# Patient Record
Sex: Female | Born: 1980 | Race: Black or African American | Hispanic: No | Marital: Single | Smoking: Current every day smoker
Health system: Southern US, Community
[De-identification: ages and names within clinical notes are randomized; demographics above are authoritative.]

---

## 2017-02-12 ENCOUNTER — Encounter (HOSPITAL_BASED_OUTPATIENT_CLINIC_OR_DEPARTMENT_OTHER): Payer: Self-pay | Admitting: *Deleted

## 2017-02-12 ENCOUNTER — Emergency Department (HOSPITAL_BASED_OUTPATIENT_CLINIC_OR_DEPARTMENT_OTHER)
Admission: EM | Admit: 2017-02-12 | Discharge: 2017-02-12 | Disposition: A | Discharge status: Home / Self Care | Payer: Medicaid Other | Attending: Emergency Medicine | Admitting: Emergency Medicine

## 2017-02-12 DIAGNOSIS — K047 Periapical abscess without sinus: Secondary | ICD-10-CM | POA: Diagnosis not present

## 2017-02-12 DIAGNOSIS — K0889 Other specified disorders of teeth and supporting structures: Secondary | ICD-10-CM | POA: Diagnosis present

## 2017-02-12 DIAGNOSIS — F17200 Nicotine dependence, unspecified, uncomplicated: Secondary | ICD-10-CM | POA: Insufficient documentation

## 2017-02-12 MED ORDER — CLINDAMYCIN HCL 150 MG PO CAPS
450.0000 mg | ORAL_CAPSULE | Freq: Once | ORAL | Status: AC
  Administered 2017-02-12: 450 mg via ORAL

## 2017-02-12 MED ORDER — HYDROCODONE-ACETAMINOPHEN 5-325 MG PO TABS
1.0000 | ORAL_TABLET | Freq: Once | ORAL | Status: AC
  Administered 2017-02-12: 1 via ORAL

## 2017-02-12 MED ORDER — CLINDAMYCIN HCL 150 MG PO CAPS: 300.0000 mg | ORAL_CAPSULE | Freq: Four times a day (QID) | ORAL | 0 refills | Status: AC

## 2017-02-12 MED ORDER — HYDROCODONE-ACETAMINOPHEN 5-325 MG PO TABS
ORAL_TABLET | ORAL | Status: AC
  Filled 2017-02-12: qty 1

## 2017-02-12 MED ORDER — CLINDAMYCIN HCL 150 MG PO CAPS
ORAL_CAPSULE | ORAL | Status: AC
  Filled 2017-02-12: qty 3

## 2017-02-12 MED ORDER — IBUPROFEN 800 MG PO TABS: 800.0000 mg | ORAL_TABLET | Freq: Three times a day (TID) | ORAL | 0 refills | Status: AC | PRN

## 2017-02-12 NOTE — ED Triage Notes (Signed)
Pt c/o dental pain x 2 days.  

## 2017-02-12 NOTE — Discharge Instructions (Signed)
You have a dental infection. It is very important that you get evaluated by a dentist as soon as possible. Call tomorrow to schedule an appointment. Ibuprofen as needed for pain. Take your full course of antibiotics. Read the instructions below.  Eat a soft or liquid diet and rinse your mouth out after meals with warm water. You should see a dentist or return here at once if you have increased swelling, increased pain or uncontrolled bleeding from the site of your injury.  SEEK MEDICAL CARE IF:  You have increased pain not controlled with medicines.  You have swelling around your tooth, in your face or neck.  You have bleeding which starts, continues, or gets worse.  You have a fever >100.4 New or worsening symptoms develop, any additional concerns.

## 2017-02-12 NOTE — ED Provider Notes (Signed)
[facility] Provider [facility]   CSN: [id number] Arrival date & time: 02/12/17  0002     History   Chief Complaint Chief Complaint  Patient presents with  . Dental Pain    HPI Kizzie FantasiaDanna Cates is a 36 y.o. female.  The history is provided by the patient and medical records. No language interpreter was used.  Dental Pain      Zoee Rosalia HammersRay is a 36 y.o. female who presents to the Emergency Department complaining of persistent, gradually worsening, right-sided, upper dental pain beginning yesterday. Pt describes their pain as throbbing.  Pain is exacerbated by eating or anything touching the tooth. They are not followed by dentistry.  She notes this same tooth broke about 2 months ago, but has not caused her any problems until yesterday. Pt denies facial swelling, fever, chills, difficulty breathing, difficulty swallowing.   History reviewed. No pertinent past medical history.  There are no active problems to display for this patient.   History reviewed. No pertinent surgical history.  OB History    No data available       Home Medications    Prior to Admission medications   Medication Sig Start Date End Date Taking? Authorizing Provider  clindamycin (CLEOCIN) 150 MG capsule Take 2 capsules (300 mg total) by mouth 4 (four) times daily. 02/12/17   Ward, Chase PicketJaime Pilcher, PA-C  ibuprofen (ADVIL,MOTRIN) 800 MG tablet Take 1 tablet (800 mg total) by mouth every 8 (eight) hours as needed for moderate pain. 02/12/17   Ward, Chase PicketJaime Pilcher, PA-C    Family History No family history on file.  Social History Social History  Substance Use Topics  . Smoking status: Current Every Day Smoker    Packs/day: 0.50  . Smokeless tobacco: Not on file  . Alcohol use No     Allergies   Patient has no known allergies.   Review of Systems Review of Systems  Constitutional: Negative for chills and fever.  HENT: Positive for dental problem. Negative for trouble swallowing.   Respiratory:  Negative for shortness of breath.      Physical Exam Updated Vital Signs BP (!) 165/99   Pulse 88   Temp 98.7 F (37.1 C)   Resp 16   Ht 5' 6.5" (1.689 m)   Wt 83 kg (183 lb)   LMP 02/04/2017   SpO2 100%   BMI 29.09 kg/m   Physical Exam  Constitutional: She appears well-developed and well-nourished. No distress.  HENT:  Head: Normocephalic and atraumatic.  Mouth/Throat:    Dental cavities and poor oral dentition noted. Pain along teeth as depicted in image. No abscess noted. Midline uvula. No trismus. OP moist and clear. No oropharyngeal erythema or edema. Neck supple with no tenderness. No facial edema.  Neck: Neck supple.  Cardiovascular: Normal rate, regular rhythm and normal heart sounds.   No murmur heard. Pulmonary/Chest: Effort normal and breath sounds normal. No respiratory distress. She has no wheezes. She has no rales.  Musculoskeletal: Normal range of motion.  Neurological: She is alert.  Skin: Skin is warm and dry.  Nursing note and vitals reviewed.    ED Treatments / Results  Labs (all labs ordered are listed, but only abnormal results are displayed) Labs Reviewed - No data to display  EKG  EKG Interpretation None       Radiology No results found.  Procedures Procedures (including critical care time)  Medications Ordered in ED Medications  clindamycin (CLEOCIN) capsule 450 mg (not administered)  HYDROcodone-acetaminophen (  NORCO/VICODIN) 5-325 MG per tablet 1 tablet (not administered)     Initial Impression / Assessment and Plan / ED Course  I have reviewed the triage vital signs and the nursing notes.  Pertinent labs & imaging results that were available during my care of the patient were reviewed by me and considered in my medical decision making (see chart for details).    Rozetta Stumpp is a 36 y.o. female who presents to ED for dental pain. No abscess requiring immediate incision and drainage. Patient is afebrile, non toxic appearing,  and swallowing secretions well. Exam not concerning for Ludwig's angina or pharyngeal abscess. Will treat with Clindamycin. I provided dental resource guide and stressed the importance of dental follow up for ultimate management of dental pain. Patient voices understanding and is agreeable to plan.  Final Clinical Impressions(s) / ED Diagnoses   Final diagnoses:  Dental infection    New Prescriptions New Prescriptions   CLINDAMYCIN (CLEOCIN) 150 MG CAPSULE    Take 2 capsules (300 mg total) by mouth 4 (four) times daily.   IBUPROFEN (ADVIL,MOTRIN) 800 MG TABLET    Take 1 tablet (800 mg total) by mouth every 8 (eight) hours as needed for moderate pain.     Ward, Chase Picket, PA-C 02/12/17 0103    Zadie Rhine, MD 02/12/17 [phone]

## 2017-09-09 ENCOUNTER — Other Ambulatory Visit: Payer: Self-pay

## 2017-09-09 ENCOUNTER — Emergency Department (HOSPITAL_BASED_OUTPATIENT_CLINIC_OR_DEPARTMENT_OTHER)
Admission: EM | Admit: 2017-09-09 | Discharge: 2017-09-09 | Disposition: A | Discharge status: Home / Self Care | Payer: Self-pay | Attending: Emergency Medicine | Admitting: Emergency Medicine

## 2017-09-09 ENCOUNTER — Emergency Department (HOSPITAL_BASED_OUTPATIENT_CLINIC_OR_DEPARTMENT_OTHER): Payer: Self-pay

## 2017-09-09 ENCOUNTER — Encounter (HOSPITAL_BASED_OUTPATIENT_CLINIC_OR_DEPARTMENT_OTHER): Payer: Self-pay

## 2017-09-09 DIAGNOSIS — M79604 Pain in right leg: Secondary | ICD-10-CM | POA: Insufficient documentation

## 2017-09-09 DIAGNOSIS — M79671 Pain in right foot: Secondary | ICD-10-CM | POA: Insufficient documentation

## 2017-09-09 DIAGNOSIS — F17210 Nicotine dependence, cigarettes, uncomplicated: Secondary | ICD-10-CM | POA: Insufficient documentation

## 2017-09-09 DIAGNOSIS — R030 Elevated blood-pressure reading, without diagnosis of hypertension: Secondary | ICD-10-CM | POA: Insufficient documentation

## 2017-09-09 LAB — PREGNANCY, URINE: PREG TEST UR: NEGATIVE

## 2017-09-09 MED ORDER — ACETAMINOPHEN 325 MG PO TABS
650.0000 mg | ORAL_TABLET | Freq: Once | ORAL | Status: AC
  Administered 2017-09-09: 650 mg via ORAL
  Filled 2017-09-09: qty 2

## 2017-09-09 MED ORDER — DICLOFENAC SODIUM 1 % TD GEL: 2.0000 g | Freq: Four times a day (QID) | TRANSDERMAL | 0 refills | Status: AC

## 2017-09-09 NOTE — ED Provider Notes (Signed)
[facility] EMERGENCY DEPARTMENT Provider Note   CSN: [id number] Arrival date & time: 09/09/17  1737     History   Chief Complaint Chief Complaint  Patient presents with  . Foot Pain    HPI Tina Lamb is a 37 y.o. female.  HPI  Patient is a 37 year old female with no significant past medical history presenting for right foot pain.  Patient reports that for the past 1-1.5 months, she has felt pain radiating from her heel up the back of her leg and up to her posterior. Patient reports that the pain will originate in her heel, and she feels she is walking on an object. Patient reports that she will feel the sharp pain rating up her leg approximately 4-5 times a day.  Patient reports she thought she may have noticed a color change with slightly blue coloration to the extremity couple weeks ago, but has not noticed any pallor or erythema.  No weakness or numbness of the right lower extremity.  No remedies tried for symptoms.   History reviewed. No pertinent past medical history.  There are no active problems to display for this patient.   History reviewed. No pertinent surgical history.   OB History   None      Home Medications    Prior to Admission medications   Medication Sig Start Date End Date Taking? Authorizing Provider  clindamycin (CLEOCIN) 150 MG capsule Take 2 capsules (300 mg total) by mouth 4 (four) times daily. 02/12/17   Ward, Chase PicketJaime Pilcher, PA-C  ibuprofen (ADVIL,MOTRIN) 800 MG tablet Take 1 tablet (800 mg total) by mouth every 8 (eight) hours as needed for moderate pain. 02/12/17   Ward, Chase PicketJaime Pilcher, PA-C    Family History No family history on file.  Social History Social History   Tobacco Use  . Smoking status: Current Every Day Smoker    Packs/day: 0.50  . Smokeless tobacco: Never Used  Substance Use Topics  . Alcohol use: No  . Drug use: No     Allergies   Patient has no known allergies.   Review of Systems Review of Systems    Constitutional: Negative for chills and fever.  Musculoskeletal: Positive for arthralgias. Negative for joint swelling and myalgias.  Neurological: Negative for weakness and numbness.     Physical Exam Updated Vital Signs BP (!) 170/114 (BP Location: Right Arm)   Pulse 88   Temp 98.1 F (36.7 C) (Oral)   Resp 20   Ht 5\' 6"  (1.676 m)   Wt 88.5 kg (195 lb 3.2 oz)   LMP 08/24/2017   SpO2 100%   BMI 31.51 kg/m   Physical Exam  Constitutional: She appears well-developed and well-nourished. No distress.  Sitting comfortably in bed.  HENT:  Head: Normocephalic and atraumatic.  Eyes: Conjunctivae are normal. Right eye exhibits no discharge. Left eye exhibits no discharge.  EOMs normal to gross examination.  Neck: Normal range of motion.  Cardiovascular: Normal rate and regular rhythm.  Intact, 2+ DP and PT pulses of bilateral lower extremity's.  Pulses symmetric.  Pulmonary/Chest:  Normal respiratory effort. Patient converses comfortably. No audible wheeze or stridor.  Abdominal: She exhibits no distension.  Musculoskeletal: Normal range of motion.  No erythema, discoloration, ecchymosis, or deformity of the right foot.  Right foot with tenderness to palpation of calcaneus.  No tenderness palpation of metatarsals or phalanges.  Compartments of the right foot are soft.  2+ DP and PT pulses.  Right knee with tenderness to  palpation of lateral knee. Full ROM. No joint line tenderness. No joint effusion or swelling appreciated. No abnormal alignment or patellar mobility. No bruising, erythema or warmth overlaying the joint. No varus/valgus laxity. Negative drawer's, Lachman's and McMurray's.  No crepitus.   All compartments are soft. Sensation intact distally.  All compartments of right lower extremity soft.  Neurological: She is alert.  Cranial nerves intact to gross observation. Patient moves extremities without difficulty.  Skin: Skin is warm and dry. She is not diaphoretic.   Psychiatric: She has a normal mood and affect. Her behavior is normal. Judgment and thought content normal.  Nursing note and vitals reviewed.    ED Treatments / Results  Labs (all labs ordered are listed, but only abnormal results are displayed) Labs Reviewed  PREGNANCY, URINE    EKG None  Radiology No results found.  Procedures Procedures (including critical care time)  Medications Ordered in ED Medications - No data to display   Initial Impression / Assessment and Plan / ED Course  I have reviewed the triage vital signs and the nursing notes.  Pertinent labs & imaging results that were available during my care of the patient were reviewed by me and considered in my medical decision making (see chart for details).     Patient well-appearing and in no acute distress.  X-Baquera of the right foot demonstrating calcaneal spur.  Suspect this is the cause of patient's pain.  Doubt DVT, as patient has had symptoms for 1-1.5 months, symptoms are intermittent, and there is no calf swelling or tenderness.  No evidence of infection in the right lower extremity.  Patient was kept diclofenac gel, and instructed on exercises for plantar fasciitis.  Patient also instructed to follow-up with podiatry for calcaneal spur.  Return precautions given for any increasing swelling or discoloration of the right lower extremity.  Patient is in understanding agrees with plan of care.  Patient noted to have elevated blood pressure today.  Discussed with patient.  Provided resources for primary care follow-up.  Final Clinical Impressions(s) / ED Diagnoses   Final diagnoses:  Right foot pain  Right leg pain  Elevated blood-pressure reading without diagnosis of hypertension    ED Discharge Orders        Ordered    diclofenac sodium (VOLTAREN) 1 % GEL  4 times daily     09/09/17 1915       Celita, Aron 09/09/17 2108    Melene Plan, DO 09/09/17 2112

## 2017-09-09 NOTE — Discharge Instructions (Signed)
Please see the information and instructions below regarding your visit.  Your diagnoses today include:  1. Right foot pain   2. Right leg pain   3. Elevated blood-pressure reading without diagnosis of hypertension    Your right foot has a heel spur in it.  This is a growth of bone.  Please see the attached information about it.  Your blood pressure is elevated today.  I would like you to find a primary care provider in order to address this.  I listed some resources on your paperwork.  Tests performed today include: See side panel of your discharge paperwork for testing performed today. Vital signs are listed at the bottom of these instructions.   Medications prescribed:    Take any prescribed medications only as prescribed, and any over the counter medications only as directed on the packaging.  Please apply the anti-inflammatory diclofenac gel up to 4 times daily around the foot and heel.  You may take Tylenol every 6 hours for pain.  You may take 650 mg.  Do not exceed 4 g of Tylenol in 1 day.  Home care instructions:  Please follow any educational materials contained in this packet.   Please freeze plastic water bottles, and roll your foot over it.  This will reduce any inflammation of the fascia (connective tissue) on the bottom of your foot as well as provide relief.  Please go to the podiatry section of the pharmacy and look for a heel cushion for heel spurs.  Follow-up instructions: Please follow-up with your primary care provider as soon as possible for further evaluation of your symptoms if they are not completely improved.   Please follow up with podiatry and/or sports medicine listed on this paperwork.  Please call to make an appointment with a podiatrist first.  Return instructions:  Please return to the Emergency Department if you experience worsening symptoms.  Please return to the emergency department for any redness or swelling of the lower extremity, pale color  or blue color, increasing pain.  Please return if you have any other emergent concerns.  Additional Information:   Your vital signs today were: BP (!) 170/114 (BP Location: Right Arm)    Pulse 88    Temp 98.1 F (36.7 C) (Oral)    Resp 20    Ht 5\' 6"  (1.676 m)    Wt 88.5 kg (195 lb 3.2 oz)    LMP 08/24/2017    SpO2 100%    BMI 31.51 kg/m  If your blood pressure (BP) was elevated on multiple readings during this visit above 130 for the top number or above 80 for the bottom number, please have this repeated by your primary care provider within one month. --------------  Thank you for allowing us to participate in your care today.

## 2017-09-09 NOTE — ED Triage Notes (Signed)
Pt c/o rt foot spur to bottom of foot radiating up leg x1.695months, worse today from standing all day; denies injury

## 2019-11-27 IMAGING — DX DG FOOT COMPLETE 3+V*R*
3 series · 3 of 3 positions shown · non-contrast
Comparison: None.

CLINICAL DATA: RIGHT foot pain for month, predominately involving
the calcaneus. History of toe fracture.

EXAM:
RIGHT FOOT COMPLETE - 3+ VIEW; RIGHT ANKLE - COMPLETE 3+ VIEW

[foot ap]
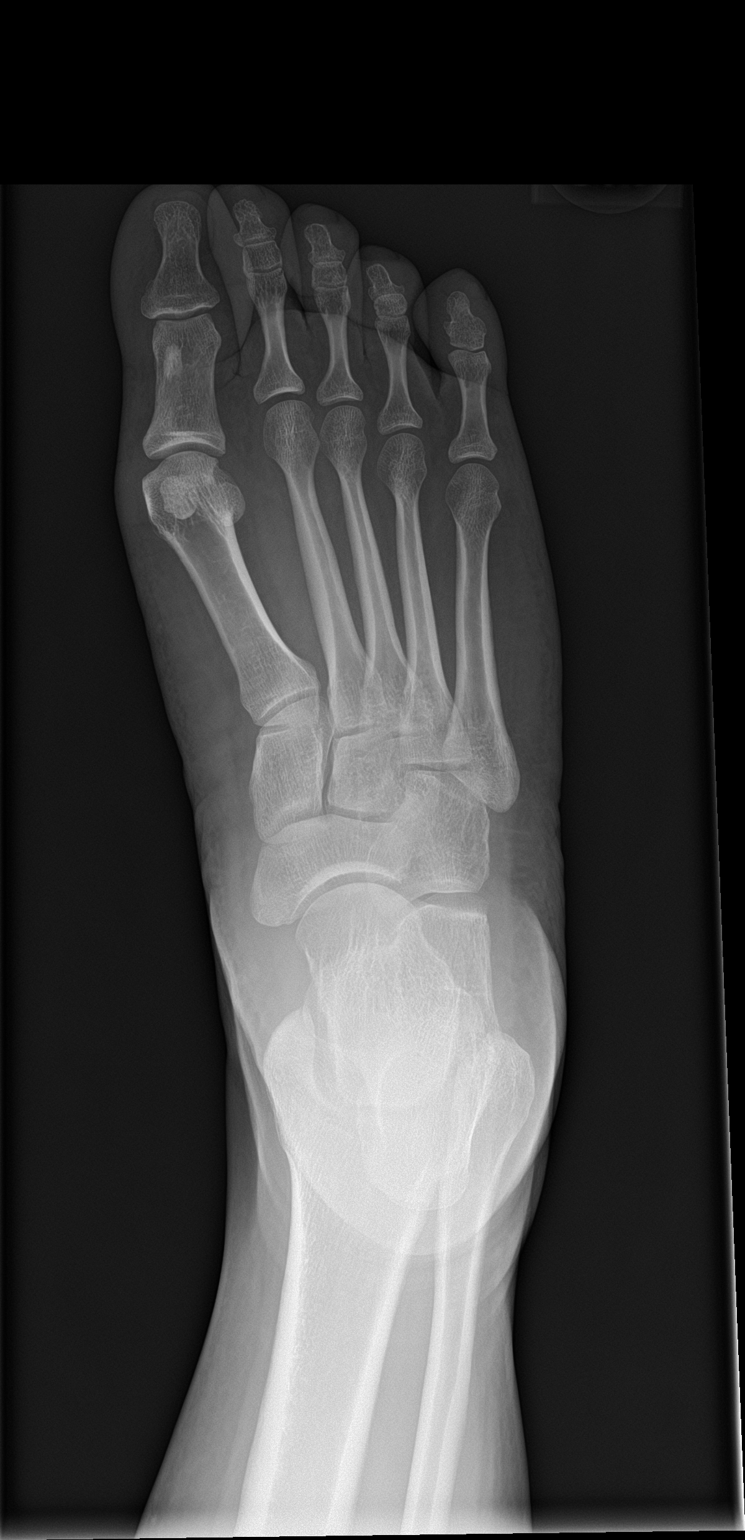

[foot obl]
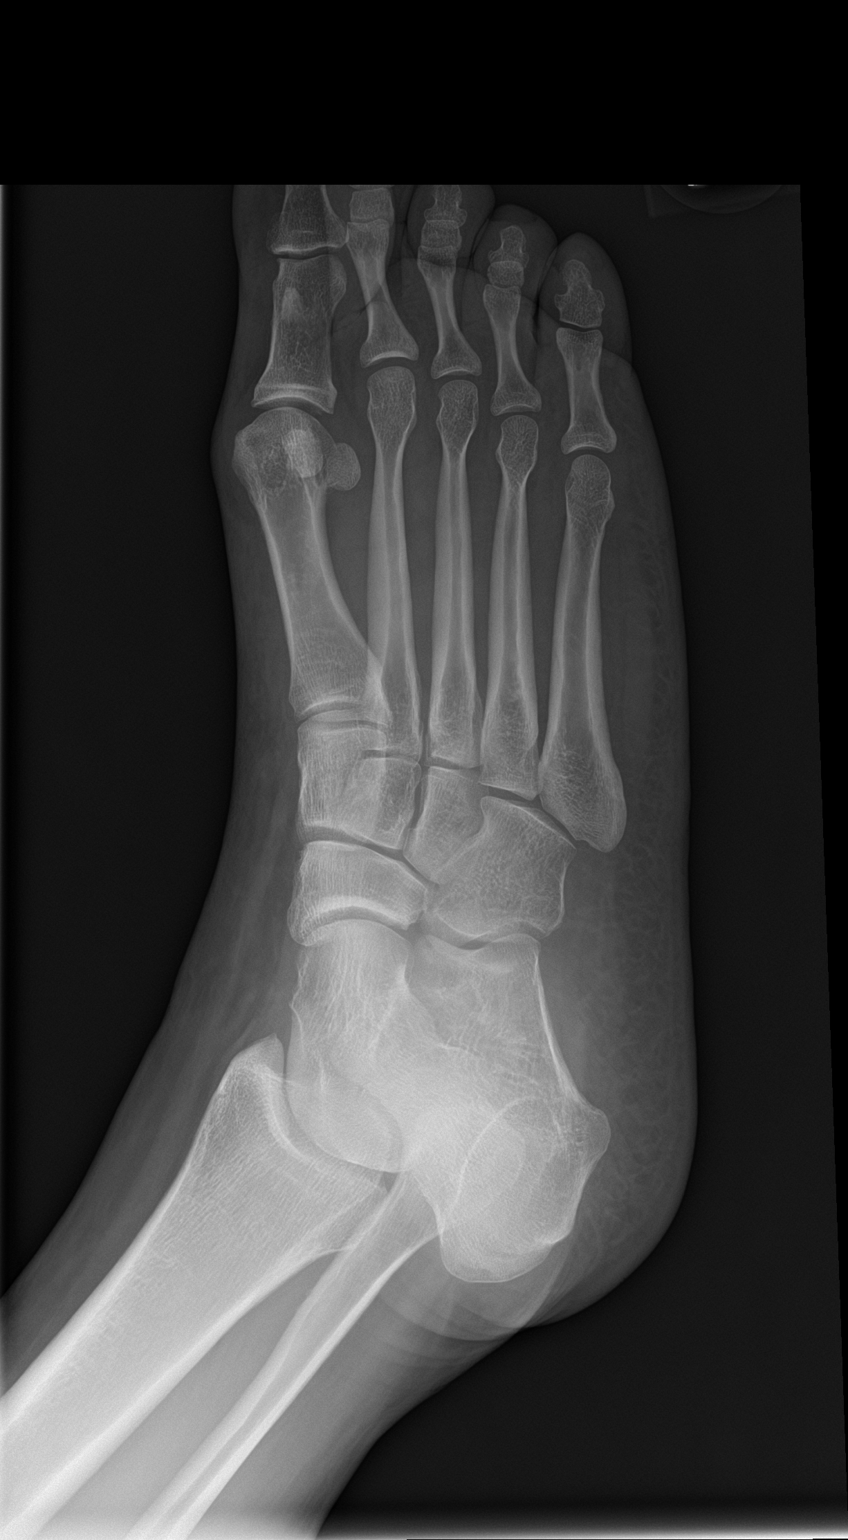

[foot lat]
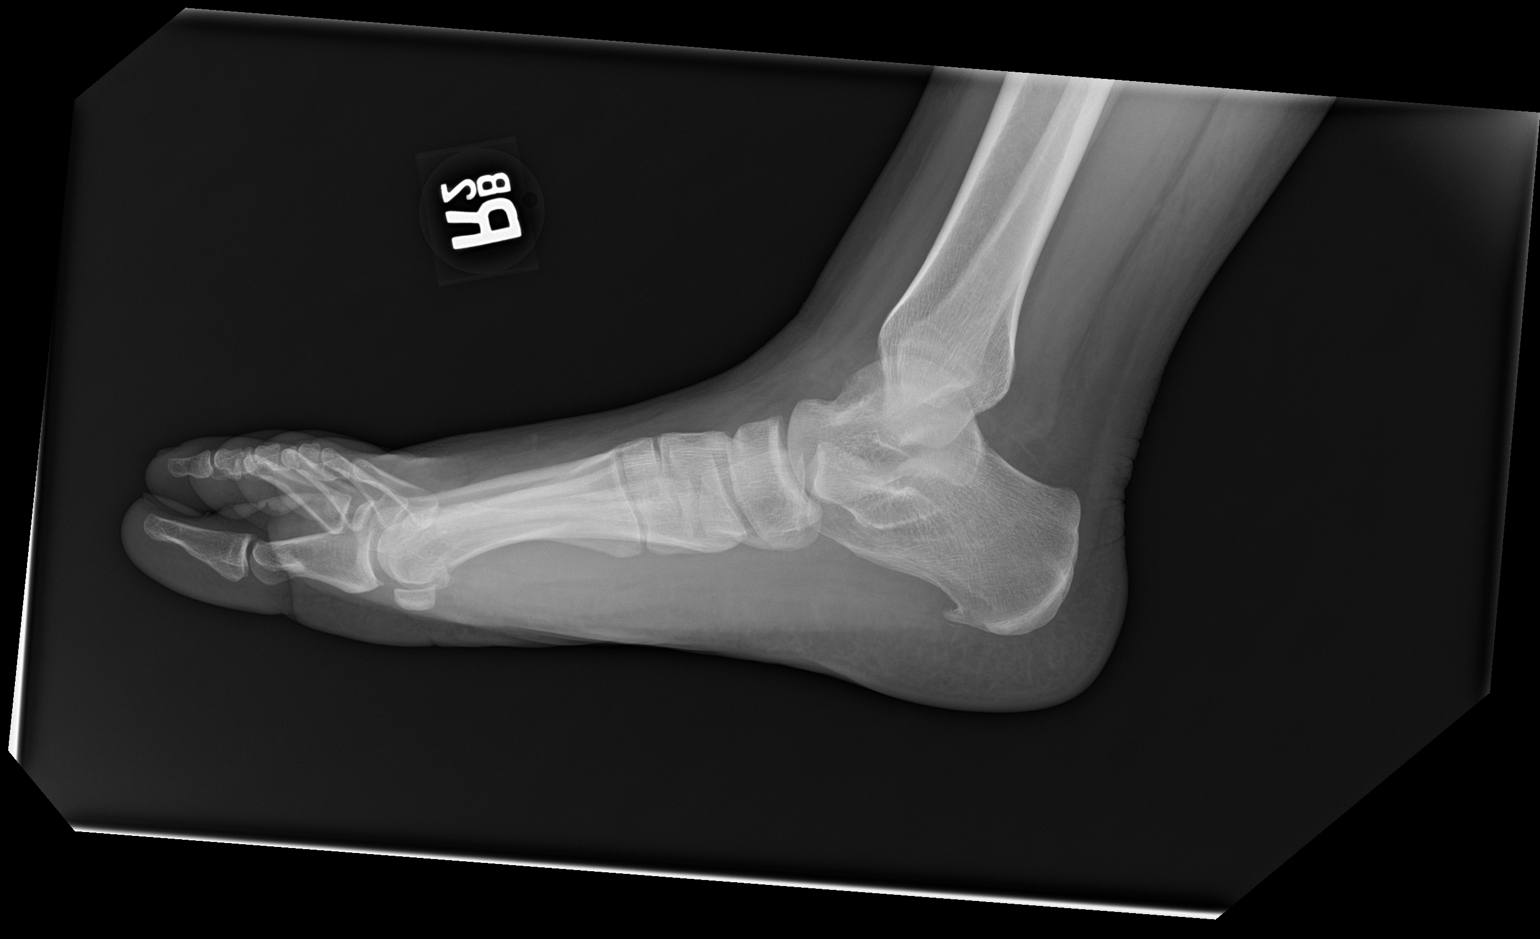

[3 of 3 positions shown; findings below may reference images not displayed]

FINDINGS: RIGHT foot: There is no evidence of fracture or dislocation. Mild
hallux valgus. Moderate plantar calcaneal spur. There is no evidence
of arthropathy or other focal bone abnormality. Mild mid and
hindfoot soft tissue swelling.

RIGHT ankle: No fracture deformity nor dislocation. The ankle
mortise appears congruent and the tibiofibular syndesmosis intact.
No destructive bony lesions. Mild lateral hindfoot soft tissue
swelling.
IMPRESSION: 1. No acute fracture deformity or dislocation.
2. Mild hallux valgus.
3. Moderate plantar calcaneal spur.
